# Patient Record
Sex: Female | Born: 1995 | Race: White | Hispanic: No | Marital: Single
Health system: Southern US, Community
[De-identification: ages and names within clinical notes are randomized; demographics above are authoritative.]

---
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
## 2022-09-23 ENCOUNTER — Ambulatory Visit: Payer: Self-pay | Admitting: Podiatry

## 2022-10-24 ENCOUNTER — Ambulatory Visit: Payer: Self-pay | Admitting: Podiatry

## 2022-10-29 ENCOUNTER — Ambulatory Visit (INDEPENDENT_AMBULATORY_CARE_PROVIDER_SITE_OTHER): Payer: BLUE CROSS/BLUE SHIELD | Admitting: Podiatry

## 2022-10-29 DIAGNOSIS — D492 Neoplasm of unspecified behavior of bone, soft tissue, and skin: Secondary | ICD-10-CM | POA: Diagnosis not present

## 2022-10-29 DIAGNOSIS — B070 Plantar wart: Secondary | ICD-10-CM | POA: Diagnosis not present

## 2022-10-29 MED ORDER — FLUOROURACIL 5 % EX CREA: TOPICAL_CREAM | Freq: Two times a day (BID) | CUTANEOUS | 0 refills | Status: AC

## 2022-10-29 NOTE — Progress Notes (Signed)
  Subjective:  Patient ID: Sandy Villarreal, female    DOB: 09/28/1995,  MRN: [id number]  Chief Complaint  Patient presents with   Plantar Warts    Bilateral plantar warts     27 y.o. female presents with the above complaint.  Patient presents with complaint of multiple bilateral foot warts.  Patient states that has been present for quite some time over a year.  She is wanted to get it evaluated she has not seen and was prior to seeing me.  She would like to discuss treatment options she is moving to [place].   Review of Systems: Negative except as noted in the HPI. Denies N/V/F/Ch.  No past medical history on file.  Current Outpatient Medications:    fluorouracil (EFUDEX) 5 % cream, Apply topically 2 (two) times daily., Disp: 40 g, Rfl: 0   ALBUTEROL SULFATE ER PO, Take by mouth., Disp: , Rfl:   Social History   Tobacco Use  Smoking Status Not on file  Smokeless Tobacco Not on file    No Known Allergies Objective:  There were no vitals filed for this visit. There is no height or weight on file to calculate BMI. Constitutional Well developed. Well nourished.  Vascular Dorsalis pedis pulses palpable bilaterally. Posterior tibial pulses palpable bilaterally. Capillary refill normal to all digits.  No cyanosis or clubbing noted. Pedal hair growth normal.  Neurologic Normal speech. Oriented to person, place, and time. Epicritic sensation to light touch grossly present bilaterally.  Dermatologic Bilateral hyperkeratotic lesion with multiple pinpoint bleeding noted upon debridement.  Pain on palpation.  Orthopedic: Normal joint ROM without pain or crepitus bilaterally. No visible deformities. No bony tenderness.   Radiographs: None Assessment:   1. Plantar verruca    Plan:  Patient was evaluated and treated and all questions answered.  Bilateral plantar verruca --Lesion was debrided today without complications. Hemostasis was achieved and the area was cleaned. Cantharone was  applied followed by an occlusive bandage. Post procedure complications were discussed. Monitor for signs or symptoms of infection and directed to call the office mainly should any occur. -Efudex cream was sent  No follow-ups on file.
